# Patient Record
Sex: Male | Born: 1995 | Race: Black or African American | Hispanic: No | Marital: Single | Smoking: Never smoker
Health system: Southern US, Community
[De-identification: ages and names within clinical notes are randomized; demographics above are authoritative.]

## PROBLEM LIST (undated history)

## (undated) DIAGNOSIS — Z9109 Other allergy status, other than to drugs and biological substances: Secondary | ICD-10-CM

## (undated) DIAGNOSIS — J45909 Unspecified asthma, uncomplicated: Secondary | ICD-10-CM

## (undated) HISTORY — PX: HERNIA REPAIR: SHX51

---
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
## 2017-05-29 ENCOUNTER — Other Ambulatory Visit: Payer: Self-pay

## 2017-05-29 ENCOUNTER — Emergency Department
Admission: EM | Admit: 2017-05-29 | Discharge: 2017-05-29 | Disposition: A | Discharge status: Home / Self Care | Payer: 59 | Source: Home / Self Care | Attending: Family Medicine | Admitting: Family Medicine

## 2017-05-29 DIAGNOSIS — R69 Illness, unspecified: Secondary | ICD-10-CM

## 2017-05-29 DIAGNOSIS — J111 Influenza due to unidentified influenza virus with other respiratory manifestations: Secondary | ICD-10-CM

## 2017-05-29 MED ORDER — BENZONATATE 200 MG PO CAPS: ORAL_CAPSULE | ORAL | 0 refills | Status: DC

## 2017-05-29 MED ORDER — AZITHROMYCIN 250 MG PO TABS: ORAL_TABLET | ORAL | 0 refills | Status: DC

## 2017-05-29 NOTE — ED Provider Notes (Signed)
[name]    CSN: [id number] Arrival date & time: 05/29/17  1127     History   Chief Complaint Chief Complaint  Patient presents with  . Cough    HPI Terry Reynolds is a 22 y.o. male.   Complains of 4 day history flu-like illness including myalgias, headache, chills, fatigue, and cough.  Also has mild nasal congestion and sore throat.  Cough is non-productive and worse at night.  He complains of tightness in his anterior chest and sensation of shortness of breath but no wheezing.   The history is provided by the patient.    Past Medical History:  Diagnosis Date  . Asthma    Pt mom states he has not had asthma symptoms or used an inhaler since middle school  . Environmental allergies     Patient Active Problem List   Diagnosis Date Noted  . URI 07/01/2009  . ALLERGIC RHINITIS 07/01/2009  . ASTHMA 07/01/2009    Past Surgical History:  Procedure Laterality Date  . HERNIA REPAIR         Home Medications    Prior to Admission medications   Medication Sig Start Date End Date Taking? Authorizing Provider  azithromycin (ZITHROMAX Z-PAK) 250 MG tablet Take 2 tabs today; then begin one tab once daily for 4 more days. 05/29/17   Lattie HawBeese, Danine Hor A, MD  benzonatate (TESSALON) 200 MG capsule Take one cap by mouth at bedtime as needed for cough.  May repeat in 4 to 6 hours 05/29/17   Lattie HawBeese, Suhaylah Wampole A, MD  penicillin v potassium (VEETID) 500 MG tablet Take one tab by mouth twice daily for 10 days 07/14/15   Lattie HawBeese, Lyrick Lagrand A, MD    Family History Family History  Problem Relation Age of Onset  . Hypertension Mother   . Thyroid disease Mother   . Hyperlipidemia Father   . Heart failure Father     Social History Social History   Tobacco Use  . Smoking status: Never Smoker  . Smokeless tobacco: Never Used  Substance Use Topics  . Alcohol use: No  . Drug use: No     Allergies   Codeine   Review of Systems Review of Systems + sore throat +  cough No pleuritic pain No wheezing + nasal congestion + post-nasal drainage No sinus pain/pressure No itchy/red eyes No earache No hemoptysis ? SOB No fever, + chills No nausea No vomiting No abdominal pain No diarrhea No urinary symptoms No skin rash + fatigue + myalgias + headache Used OTC meds without relief   Physical Exam Triage Vital Signs ED Triage Vitals [05/29/17 1154]  Enc Vitals Group     BP 108/74     Pulse Rate 66     Resp      Temp 98.3 F (36.8 C)     Temp Source Oral     SpO2 97 %     Weight 137 lb (62.1 kg)     Height 5\' 9"  (1.753 m)     Head Circumference      Peak Flow      Pain Score 0     Pain Loc      Pain Edu?      Excl. in GC?    No data found.  Updated Vital Signs BP 108/74 (BP Location: Right Arm)   Pulse 66   Temp 98.3 F (36.8 C) (Oral)   Ht 5\' 9"  (1.753 m)   Wt 137 lb (62.1  kg)   SpO2 97%   BMI 20.23 kg/m   Visual Acuity Right Eye Distance:   Left Eye Distance:   Bilateral Distance:    Right Eye Near:   Left Eye Near:    Bilateral Near:     Physical Exam Nursing notes and Vital Signs reviewed. Appearance:  Patient appears stated age, and in no acute distress Eyes:  Pupils are equal, round, and reactive to light and accomodation.  Extraocular movement is intact.  Conjunctivae are not inflamed  Ears:  Canals normal.  Tympanic membranes normal.  Nose:  Mildly congested turbinates.  No sinus tenderness.  Pharynx:  Normal Neck:  Supple.  Enlarged posterior/lateral nodes are palpated bilaterally, tender to palpation on the left.   Lungs:  Clear to auscultation.  Breath sounds are equal.  Moving air well. Chest:  Distinct tenderness to palpation over the mid-sternum.  Heart:  Regular rate and rhythm without murmurs, rubs, or gallops.  Abdomen:  Nontender without masses or hepatosplenomegaly.  Bowel sounds are present.  No CVA or flank tenderness.  Extremities:  No edema.  Skin:  No rash present.    UC Treatments /  Results  Labs (all labs ordered are listed, but only abnormal results are displayed) Labs Reviewed - No data to display  EKG  EKG Interpretation None       Radiology No results found.  Procedures Procedures (including critical care time)  Medications Ordered in UC Medications - No data to display   Initial Impression / Assessment and Plan / UC Course  I have reviewed the triage vital signs and the nursing notes.  Pertinent labs & imaging results that were available during my care of the patient were reviewed by me and considered in my medical decision making (see chart for details).    Patient has missed 48 hour window of opportunity for beginning Tamiflu.  Begin empiric Z-pak.  Prescription written for Benzonatate [facility]) to take at bedtime for night-time cough.  Take plain guaifenesin (1200mg  extended release tabs such as Mucinex) twice daily, with plenty of water, for cough and congestion.  May add Pseudoephedrine (30mg , one or two every 4 to 6 hours) for sinus congestion.  Get adequate rest.   May use Afrin nasal spray (or generic oxymetazoline) each morning for about 5 days and then discontinue.  Also recommend using saline nasal spray several times daily and saline nasal irrigation (AYR is a common brand).  Use Flonase nasal spray each morning after using Afrin nasal spray and saline nasal irrigation. Try warm salt water gargles for sore throat.  Stop all antihistamines for now, and other non-prescription cough/cold preparations. May take Ibuprofen 200mg , 4 tabs every 8 hours with food for chest/sternum discomfort. Followup with Family Doctor if not improved in one week.     Final Clinical Impressions(s) / UC Diagnoses   Final diagnoses:  Influenza-like illness    ED Discharge Orders        Ordered    azithromycin (ZITHROMAX Z-PAK) 250 MG tablet     05/29/17 1222    benzonatate (TESSALON) 200 MG capsule     05/29/17 1222           Lattie Haw,  MD 05/29/17 1228

## 2017-05-29 NOTE — ED Triage Notes (Signed)
Congestion and cough started Tuesday, soreness and tightness in the chest started yesterday.

## 2017-05-29 NOTE — Discharge Instructions (Signed)
Take plain guaifenesin (1200mg extended release tabs such as Mucinex) twice daily, with plenty of water, for cough and congestion.  May add Pseudoephedrine (30mg, one or two every 4 to 6 hours) for sinus congestion.  Get adequate rest.   °May use Afrin nasal spray (or generic oxymetazoline) each morning for about 5 days and then discontinue.  Also recommend using saline nasal spray several times daily and saline nasal irrigation (AYR is a common brand).  Use Flonase nasal spray each morning after using Afrin nasal spray and saline nasal irrigation. °Try warm salt water gargles for sore throat.  °Stop all antihistamines for now, and other non-prescription cough/cold preparations. °May take Ibuprofen 200mg, 4 tabs every 8 hours with food for chest/sternum discomfort. °  °

## 2018-10-25 ENCOUNTER — Emergency Department
Admission: EM | Admit: 2018-10-25 | Discharge: 2018-10-25 | Disposition: A | Discharge status: Home / Self Care | Payer: 59 | Source: Home / Self Care | Attending: Family Medicine | Admitting: Family Medicine

## 2018-10-25 ENCOUNTER — Other Ambulatory Visit: Payer: Self-pay

## 2018-10-25 DIAGNOSIS — R059 Cough, unspecified: Secondary | ICD-10-CM

## 2018-10-25 DIAGNOSIS — J029 Acute pharyngitis, unspecified: Secondary | ICD-10-CM | POA: Diagnosis not present

## 2018-10-25 DIAGNOSIS — R05 Cough: Secondary | ICD-10-CM

## 2018-10-25 DIAGNOSIS — J069 Acute upper respiratory infection, unspecified: Secondary | ICD-10-CM

## 2018-10-25 LAB — POCT RAPID STREP A (OFFICE): Rapid Strep A Screen: NEGATIVE

## 2018-10-25 NOTE — ED Triage Notes (Signed)
Sore throat started yesterday.  Coughing for 2-3 weeks

## 2018-10-25 NOTE — ED Provider Notes (Signed)
[name]    CSN: [id number] Arrival date & time: 10/25/18  0855     History   Chief Complaint Chief Complaint  Patient presents with  . Cough  . Sore Throat    HPI Terry Reynolds is a 23 y.o. male.   Patient developed a mild cough about 2 weeks ago but has not felt ill.  Yesterday he developed a "scratchy" throat, but feels well otherwise.  No fevers, chills, and sweats.  No chest tightness, shortness of breath, or wheezing.  No loss of taste/smell.  He has seasonal rhinitis and a past history of asthma.  The history is provided by the patient.    Past Medical History:  Diagnosis Date  . Asthma    Pt mom states he has not had asthma symptoms or used an inhaler since middle school  . Environmental allergies     Patient Active Problem List   Diagnosis Date Noted  . URI 07/01/2009  . ALLERGIC RHINITIS 07/01/2009  . ASTHMA 07/01/2009    Past Surgical History:  Procedure Laterality Date  . HERNIA REPAIR         Home Medications    Prior to Admission medications   Not on File    Family History Family History  Problem Relation Age of Onset  . Hypertension Mother   . Thyroid disease Mother   . Hyperlipidemia Father   . Heart failure Father     Social History Social History   Tobacco Use  . Smoking status: Never Smoker  . Smokeless tobacco: Never Used  Substance Use Topics  . Alcohol use: No  . Drug use: No     Allergies   Codeine   Review of Systems Review of Systems + sore throat + cough No pleuritic pain No wheezing Minimal nasal congestion No post-nasal drainage No sinus pain/pressure No itchy/red eyes No earache No hemoptysis No SOB No fever/chills No nausea No vomiting No abdominal pain No diarrhea No urinary symptoms No skin rash No fatigue No myalgias No headache Used OTC meds without relief   Physical Exam Triage Vital Signs ED Triage Vitals  Enc Vitals Group     BP 10/25/18 0911 133/77    Pulse Rate 10/25/18 0911 73     Resp 10/25/18 0911 20     Temp 10/25/18 0911 98.2 F (36.8 C)     Temp Source 10/25/18 0911 Oral     SpO2 10/25/18 0911 99 %     Weight 10/25/18 0912 165 lb (74.8 kg)     Height 10/25/18 0912 5\' 9"  (1.753 m)     Head Circumference --      Peak Flow --      Pain Score 10/25/18 0912 0     Pain Loc --      Pain Edu? --      Excl. in GC? --    No data found.  Updated Vital Signs BP 133/77 (BP Location: Right Arm)   Pulse 73   Temp 98.2 F (36.8 C) (Oral)   Resp 20   Ht 5\' 9"  (1.753 m)   Wt 74.8 kg   SpO2 99%   BMI 24.37 kg/m   Visual Acuity Right Eye Distance:   Left Eye Distance:   Bilateral Distance:    Right Eye Near:   Left Eye Near:    Bilateral Near:     Physical Exam Nursing notes and Vital Signs reviewed. Appearance:  Patient appears stated age, and in  no acute distress Eyes:  Pupils are equal, round, and reactive to light and accomodation.  Extraocular movement is intact.  Conjunctivae are not inflamed  Ears:  Canals normal.  Tympanic membranes normal.  Nose:  Mildly congested turbinates.  No sinus tenderness.   Pharynx:  Normal Neck:  Supple.  Enlarged posterior/lateral nodes are palpated bilaterally, tender to palpation on the left.   Lungs:  Clear to auscultation.  Breath sounds are equal.  Moving air well. Heart:  Regular rate and rhythm without murmurs, rubs, or gallops.  Abdomen:  Nontender without masses or hepatosplenomegaly.  Bowel sounds are present.  No CVA or flank tenderness.  Extremities:  No edema.  Skin:  No rash present.    UC Treatments / Results  Labs (all labs ordered are listed, but only abnormal results are displayed) Labs Reviewed  NOVEL CORONAVIRUS, NAA  STREP A DNA PROBE  POCT RAPID STREP A (OFFICE) negative    EKG   Radiology No results found.  Procedures Procedures (including critical care time)  Medications Ordered in UC Medications - No data to display  Initial Impression /  Assessment and Plan / UC Course  I have reviewed the triage vital signs and the nursing notes.  Pertinent labs & imaging results that were available during my care of the patient were reviewed by me and considered in my medical decision making (see chart for details).    There is no evidence of bacterial infection today.   Treat symptomatically for now.  Strep A DNA probe and COVID10 pending. Followup with Family Doctor if not improved in 7 to 10 days.   Final Clinical Impressions(s) / UC Diagnoses   Final diagnoses:  Sore throat  Cough  Viral URI with cough     Discharge Instructions     Take plain guaifenesin (1200mg  extended release tabs such as Mucinex) twice daily, with plenty of water, for cough and congestion.  May add Pseudoephedrine (30mg , one or two every 4 to 6 hours) for sinus congestion.  Get adequate rest.    Try warm salt water gargles for sore throat.  Stop all antihistamines for now, and other non-prescription cough/cold preparations. May take Ibuprofen 200mg , 4 tabs every 8 hours with food for sore throat May take Delsym Cough Suppressant at bedtime for nighttime cough.     ED Prescriptions    None           Kandra Nicolas, MD 10/25/18 [phone]

## 2018-10-25 NOTE — Discharge Instructions (Addendum)
Take plain guaifenesin (1200mg  extended release tabs such as Mucinex) twice daily, with plenty of water, for cough and congestion.  May add Pseudoephedrine (30mg , one or two every 4 to 6 hours) for sinus congestion.  Get adequate rest.    Try warm salt water gargles for sore throat.  Stop all antihistamines for now, and other non-prescription cough/cold preparations. May take Ibuprofen 200mg , 4 tabs every 8 hours with food for sore throat May take Delsym Cough Suppressant at bedtime for nighttime cough.

## 2018-10-26 ENCOUNTER — Telehealth: Payer: Self-pay

## 2018-10-26 LAB — STREP A DNA PROBE: Group A Strep Probe: NOT DETECTED

## 2018-10-26 NOTE — Telephone Encounter (Signed)
Spoke with patient, gave strep results.  Will call with COVID once resulted.

## 2018-10-27 LAB — NOVEL CORONAVIRUS, NAA: SARS-CoV-2, NAA: NOT DETECTED

## 2018-11-03 NOTE — Telephone Encounter (Signed)
Spoke with patient, Gave Neg COVID results

## 2019-05-05 ENCOUNTER — Emergency Department
Admission: EM | Admit: 2019-05-05 | Discharge: 2019-05-05 | Disposition: A | Discharge status: Home / Self Care | Payer: 59 | Source: Home / Self Care | Attending: Family Medicine | Admitting: Family Medicine

## 2019-05-05 ENCOUNTER — Other Ambulatory Visit: Payer: Self-pay

## 2019-05-05 DIAGNOSIS — R300 Dysuria: Secondary | ICD-10-CM | POA: Diagnosis not present

## 2019-05-05 DIAGNOSIS — R369 Urethral discharge, unspecified: Secondary | ICD-10-CM

## 2019-05-05 LAB — POCT URINALYSIS DIP (MANUAL ENTRY)
Bilirubin, UA: NEGATIVE
Glucose, UA: NEGATIVE mg/dL
Nitrite, UA: NEGATIVE
Spec Grav, UA: 1.015 (ref 1.010–1.025)
Urobilinogen, UA: 0.2 E.U./dL
pH, UA: 6 (ref 5.0–8.0)

## 2019-05-05 MED ORDER — AZITHROMYCIN 1 G PO PACK
1.0000 g | PACK | Freq: Once | ORAL | Status: AC
  Administered 2019-05-05: 1 g via ORAL

## 2019-05-05 MED ORDER — CEFTRIAXONE SODIUM 500 MG IJ SOLR
500.0000 mg | Freq: Once | INTRAMUSCULAR | Status: AC
  Administered 2019-05-05: 500 mg via INTRAMUSCULAR

## 2019-05-05 NOTE — ED Provider Notes (Signed)
[facility]    CSN: [id number] Arrival date & time: 05/05/19  1714      History   Chief Complaint Chief Complaint  Patient presents with  . Dysuria    tingling  . Penile Discharge    HPI TRYGG MANTZ II is a 24 y.o. male.   Patient developed dysuria and whitish urethral discharge about 4 days ago.  He had unprotected sex about 12 days ago and is concerned about possible STD.  He denies testicular pain/swelling, rash, abdominal pain, and fever.  He feels well otherwise.  He has not had STD testing in the past.  The history is provided by the patient.  Dysuria Presenting symptoms: dysuria and penile discharge   Presenting symptoms: no penile pain   Context: spontaneously   Relieved by:  Nothing Worsened by:  Urination Ineffective treatments:  None tried Associated symptoms: no abdominal pain, no fever, no flank pain, no genital itching, no genital lesions, no genital rash, no groin pain, no hematuria, no nausea, no penile redness, no penile swelling, no scrotal swelling, no urinary frequency, no urinary hesitation, no urinary incontinence and no urinary retention   Risk factors: unprotected sex   Penile Discharge Pertinent negatives include no abdominal pain.    Past Medical History:  Diagnosis Date  . Asthma    Pt mom states he has not had asthma symptoms or used an inhaler since middle school  . Environmental allergies     Patient Active Problem List   Diagnosis Date Noted  . URI 07/01/2009  . ALLERGIC RHINITIS 07/01/2009  . ASTHMA 07/01/2009    Past Surgical History:  Procedure Laterality Date  . HERNIA REPAIR         Home Medications    Prior to Admission medications   Not on File    Family History Family History  Problem Relation Age of Onset  . Hypertension Mother   . Thyroid disease Mother   . Hyperlipidemia Father   . Heart failure Father     Social History Social History   Tobacco Use  . Smoking status: Never Smoker    . Smokeless tobacco: Never Used  Substance Use Topics  . Alcohol use: Yes    Comment: twice a week  . Drug use: No     Allergies   Codeine   Review of Systems Review of Systems  Constitutional: Negative.  Negative for fever.  Gastrointestinal: Negative for abdominal pain and nausea.  Genitourinary: Positive for discharge and dysuria. Negative for bladder incontinence, decreased urine volume, flank pain, frequency, hematuria, hesitancy, penile pain, penile swelling, scrotal swelling, testicular pain and urgency.  Musculoskeletal: Negative for arthralgias.  Skin: Negative for rash.  All other systems reviewed and are negative.    Physical Exam Triage Vital Signs ED Triage Vitals [05/05/19 1730]  Enc Vitals Group     BP 136/80     Pulse Rate 81     Resp 20     Temp 98.2 F (36.8 C)     Temp Source Oral     SpO2 98 %     Weight 156 lb (70.8 kg)     Height 5\' 9"  (1.753 m)     Head Circumference      Peak Flow      Pain Score 0     Pain Loc      Pain Edu?      Excl. in [place]?    No data found.  Updated Vital Signs BP  136/80 (BP Location: Right Arm)   Pulse 81   Temp 98.2 F (36.8 C) (Oral)   Resp 20   Ht 5\' 9"  (1.753 m)   Wt 70.8 kg   SpO2 98%   BMI 23.04 kg/m   Visual Acuity Right Eye Distance:   Left Eye Distance:   Bilateral Distance:    Right Eye Near:   Left Eye Near:    Bilateral Near:     Physical Exam Vitals and nursing note reviewed.  Constitutional:      General: He is not in acute distress.    Appearance: He is not ill-appearing.  HENT:     Head: Normocephalic.  Eyes:     Pupils: Pupils are equal, round, and reactive to light.  Cardiovascular:     Rate and Rhythm: Normal rate.  Pulmonary:     Effort: Pulmonary effort is normal.  Neurological:     Mental Status: He is alert.      UC Treatments / Results  Labs (all labs ordered are listed, but only abnormal results are displayed) Labs Reviewed  POCT URINALYSIS DIP (MANUAL  ENTRY) - Abnormal; Notable for the following components:      Result Value   Ketones, POC UA small (15) (*)    Blood, UA moderate (*)    Protein Ur, POC trace (*)    Leukocytes, UA Small (1+) (*)    All other components within normal limits  C. TRACHOMATIS/N. GONORRHOEAE RNA  URINE CULTURE  HIV ANTIBODY (ROUTINE TESTING W REFLEX)  RPR    EKG   Radiology No results found.  Procedures Procedures (including critical care time)  Medications Ordered in UC Medications  cefTRIAXone (ROCEPHIN) injection 500 mg (500 mg Intramuscular Given 05/05/19 1757)  azithromycin (ZITHROMAX) powder 1 g (1 g Oral Given 05/05/19 1804)    Initial Impression / Assessment and Plan / UC Course  I have reviewed the triage vital signs and the nursing notes.  Pertinent labs & imaging results that were available during my care of the patient were reviewed by me and considered in my medical decision making (see chart for details).    Administered Rocephin 500mg  IM, azithromycin 1gm PO. GC/chlamydia, RPR, urine culture, and HIV antibodies pending. Followup with Family Doctor if not improved in one week.     Final Clinical Impressions(s) / UC Diagnoses   Final diagnoses:  Dysuria  Urethral discharge in male     Discharge Instructions     Please refrain from sexual intercourse for 7 days to give the medicine time to work.  Sexual partners need to be notified and tested/treated.  Condoms may reduce risk of reinfection.  Recheck or followup with PCP for further evaluation if symptoms are not improving.     ED Prescriptions    None        07/03/19, MD 05/05/19 1820

## 2019-05-05 NOTE — ED Triage Notes (Signed)
Pt noticed tingling and discharge from the penis several days.  Pt states that he had unprotected sex about 12 days ago.  Pt is concerned about STD.

## 2019-05-05 NOTE — Discharge Instructions (Addendum)
Please refrain from sexual intercourse for 7 days to give the medicine time to work.  Sexual partners need to be notified and tested/treated.  Condoms may reduce risk of reinfection.  Recheck or followup with PCP for further evaluation if symptoms are not improving.

## 2019-05-06 LAB — URINE CULTURE
MICRO NUMBER:: 10118039
Result:: NO GROWTH
SPECIMEN QUALITY:: ADEQUATE

## 2019-05-06 LAB — C. TRACHOMATIS/N. GONORRHOEAE RNA
C. trachomatis RNA, TMA: DETECTED — AB
N. gonorrhoeae RNA, TMA: NOT DETECTED

## 2019-05-06 LAB — HIV ANTIBODY (ROUTINE TESTING W REFLEX): HIV 1&2 Ab, 4th Generation: NONREACTIVE

## 2019-05-09 ENCOUNTER — Telehealth (HOSPITAL_COMMUNITY): Payer: Self-pay | Admitting: Emergency Medicine

## 2019-05-09 NOTE — Telephone Encounter (Signed)
Chlamydia is positive.  This was treated at the urgent care visit with po zithromax 1g.  Please refrain from sexual intercourse for 7 days to give the medicine time to work.  Sexual partners need to be notified and tested/treated.  Condoms may reduce risk of reinfection.  Recheck or followup with PCP for further evaluation if symptoms are not improving.  [facility] notified.  Patient contacted by phone and made aware of    results. Pt verbalized understanding and had all questions answered.    

## 2019-06-01 ENCOUNTER — Other Ambulatory Visit: Payer: Self-pay

## 2019-06-01 ENCOUNTER — Emergency Department (INDEPENDENT_AMBULATORY_CARE_PROVIDER_SITE_OTHER)
Admission: EM | Admit: 2019-06-01 | Discharge: 2019-06-01 | Disposition: A | Discharge status: Home / Self Care | Payer: 59 | Source: Home / Self Care | Attending: Family Medicine | Admitting: Family Medicine

## 2019-06-01 ENCOUNTER — Encounter: Payer: Self-pay | Admitting: Emergency Medicine

## 2019-06-01 DIAGNOSIS — R55 Syncope and collapse: Secondary | ICD-10-CM | POA: Diagnosis not present

## 2019-06-01 NOTE — ED Triage Notes (Signed)
Was being examined by Optomologist, Dr was pressing on his eye with a q-tip he felt hot and started sliding out of the chair. EMS was called, checked his BP, EKG, CBG all normal, feels fine now.

## 2019-06-01 NOTE — ED Provider Notes (Signed)
[facility]    CSN: [id number] Arrival date & time: 06/01/19  1713      History   Chief Complaint Chief Complaint  Patient presents with  . Loss of Consciousness    HPI Terry Reynolds is a 24 y.o. male.   While in an opthomologist's office about 5.5 hours ago, patient suddenly lost consciousness briefly after the provider began to examine his right eye.  He suddenly began to feel hot and light-headed before losing consciousness but awoke rapidly.  EMS was called, and found his BP, CBG and EKG to be normal. Patient is completely assymptomatic at present.   The history is provided by the patient.  Loss of Consciousness Episode history:  Single Most recent episode:  Today Progression:  Resolved Chronicity:  New Context comment:  Eye examination Witnessed: yes   Relieved by:  Nothing Worsened by:  Nothing Ineffective treatments:  None tried Associated symptoms: dizziness and visual change   Associated symptoms: no chest pain, no confusion, no diaphoresis, no difficulty breathing, no fever, no focal sensory loss, no focal weakness, no headaches, no malaise/fatigue, no nausea, no palpitations, no recent fall, no seizures, no shortness of breath and no vomiting     Past Medical History:  Diagnosis Date  . Asthma    Pt mom states he has not had asthma symptoms or used an inhaler since middle school  . Environmental allergies     Patient Active Problem List   Diagnosis Date Noted  . URI 07/01/2009  . ALLERGIC RHINITIS 07/01/2009  . ASTHMA 07/01/2009    Past Surgical History:  Procedure Laterality Date  . HERNIA REPAIR         Home Medications    Prior to Admission medications   Not on File    Family History Family History  Problem Relation Age of Onset  . Hypertension Mother   . Thyroid disease Mother   . Hyperlipidemia Father   . Heart failure Father     Social History Social History   Tobacco Use  . Smoking status: Never Smoker    . Smokeless tobacco: Never Used  Substance Use Topics  . Alcohol use: Yes    Comment: twice a week  . Drug use: No     Allergies   Codeine   Review of Systems Review of Systems  Constitutional: Negative for diaphoresis, fever and malaise/fatigue.  Respiratory: Negative for shortness of breath.   Cardiovascular: Positive for syncope. Negative for chest pain and palpitations.  Gastrointestinal: Negative for nausea and vomiting.  Neurological: Positive for dizziness. Negative for focal weakness, seizures and headaches.  Psychiatric/Behavioral: Negative for confusion.  All other systems reviewed and are negative.    Physical Exam Triage Vital Signs ED Triage Vitals  Enc Vitals Group     BP 06/01/19 1734 126/77     Pulse Rate 06/01/19 1734 67     Resp --      Temp 06/01/19 1734 98.7 F (37.1 C)     Temp Source 06/01/19 1734 Oral     SpO2 06/01/19 1734 100 %     Weight 06/01/19 1735 160 lb (72.6 kg)     Height 06/01/19 1735 5\' 9"  (1.753 m)     Head Circumference --      Peak Flow --      Pain Score 06/01/19 1735 0     Pain Loc --      Pain Edu? --      Excl. in [place]? --  Orthostatic VS for the past 24 hrs:  BP- Lying Pulse- Lying BP- Sitting Pulse- Sitting BP- Standing at 0 minutes Pulse- Standing at 0 minutes  06/01/19 1738 125/74 72 125/77 73 123/83 83    Updated Vital Signs BP 126/77 (BP Location: Right Arm)   Pulse 67   Temp 98.7 F (37.1 C) (Oral)   Ht 5\' 9"  (1.753 m)   Wt 72.6 kg   SpO2 100%   BMI 23.63 kg/m   Visual Acuity Right Eye Distance:   Left Eye Distance:   Bilateral Distance:    Right Eye Near:   Left Eye Near:    Bilateral Near:     Physical Exam Nursing notes and Vital Signs reviewed. Appearance:  Patient appears stated age, and in no acute distress Eyes:  Pupils are equal, round, and reactive to light and accomodation.  Extraocular movement is intact.  Conjunctivae are not inflamed  Ears:  Canals normal.  Tympanic membranes  normal.  Nose:  Normal turbinates.  No sinus tenderness.   Pharynx:  Normal Neck:  Supple. No adenopathy or thyromegaly.  Carotids have normal upstrokes. Lungs:  Clear to auscultation.  Breath sounds are equal.  Moving air well. Heart:  Regular rate and rhythm without murmurs, rubs, or gallops.  Abdomen:  Nontender without masses or hepatosplenomegaly.  Bowel sounds are present.  No CVA or flank tenderness.  Extremities:  No edema.  Skin:  No rash present.   UC Treatments / Results  Labs (all labs ordered are listed, but only abnormal results are displayed) Labs Reviewed - No data to display  EKG Review of EKG done by EMS today: Rate:  53 BPM PR:  150 msec QT:  428 msec QTcH:  416 msec QRSD:  88/2 msec QRS axis:  48 degrees Interpretation:   Sinus bradycardia; otherwise no acute changes  Radiology No results found.  Procedures Procedures (including critical care time)  Medications Ordered in UC Medications - No data to display  Initial Impression / Assessment and Plan / UC Course  I have reviewed the triage vital signs and the nursing notes.  Pertinent labs & imaging results that were available during my care of the patient were reviewed by me and considered in my medical decision making (see chart for details).    Patient has benign exam, and orthostatic vital signs show no significant variation.  EKG done by EMS normal except for mild sinus bradycardia.  Patient appears to have had an episode of vasovagal syncope.   Final Clinical Impressions(s) / UC Diagnoses   Final diagnoses:  Vasovagal syncope     Discharge Instructions     Stay well hydrated:  drink plenty of fluids.  If symptoms become significantly worse during the night or over the weekend, proceed to the local emergency room.     ED Prescriptions    None        , MD 06/02/19 [phone]

## 2019-06-01 NOTE — Discharge Instructions (Signed)
Stay well hydrated:  drink plenty of fluids.  If symptoms become significantly worse during the night or over the weekend, proceed to the local emergency room.

## 2019-06-30 ENCOUNTER — Other Ambulatory Visit: Payer: Self-pay

## 2019-06-30 ENCOUNTER — Emergency Department
Admission: EM | Admit: 2019-06-30 | Discharge: 2019-06-30 | Disposition: A | Discharge status: Home / Self Care | Payer: 59 | Source: Home / Self Care | Attending: Family Medicine | Admitting: Family Medicine

## 2019-06-30 DIAGNOSIS — Z202 Contact with and (suspected) exposure to infections with a predominantly sexual mode of transmission: Secondary | ICD-10-CM

## 2019-06-30 MED ORDER — CEFTRIAXONE SODIUM 500 MG IJ SOLR
500.0000 mg | Freq: Once | INTRAMUSCULAR | Status: AC
  Administered 2019-06-30: 18:00:00 500 mg via INTRAMUSCULAR

## 2019-06-30 MED ORDER — AZITHROMYCIN 1 G PO PACK
1.0000 g | PACK | Freq: Once | ORAL | Status: AC
  Administered 2019-06-30: 1 g via ORAL

## 2019-06-30 NOTE — ED Provider Notes (Signed)
[facility]    CSN: [id number] Arrival date & time: 06/30/19  1628      History   Chief Complaint Chief Complaint  Patient presents with  . Exposure to STD    HPI Terry Reynolds is a 24 y.o. male.   Patient presents for STD testing after being informed by his partner that he needs to be tested.  He is completely assymptomatic at present.  The history is provided by the patient.    Past Medical History:  Diagnosis Date  . Asthma    Pt mom states he has not had asthma symptoms or used an inhaler since middle school  . Environmental allergies     Patient Active Problem List   Diagnosis Date Noted  . URI 07/01/2009  . ALLERGIC RHINITIS 07/01/2009  . ASTHMA 07/01/2009    Past Surgical History:  Procedure Laterality Date  . HERNIA REPAIR         Home Medications    Prior to Admission medications   Not on File    Family History Family History  Problem Relation Age of Onset  . Hypertension Mother   . Thyroid disease Mother   . Hyperlipidemia Father   . Heart failure Father     Social History Social History   Tobacco Use  . Smoking status: Never Smoker  . Smokeless tobacco: Never Used  Substance Use Topics  . Alcohol use: Yes    Comment: socially  . Drug use: No     Allergies   Codeine   Review of Systems Review of Systems  Constitutional: Negative for activity change, chills, diaphoresis, fatigue, fever and unexpected weight change.  HENT: Negative.   Eyes: Negative.   Respiratory: Negative.   Cardiovascular: Negative.   Gastrointestinal: Negative.   Genitourinary: Negative for discharge, dysuria, flank pain, frequency, genital sores, hematuria, penile swelling, scrotal swelling, testicular pain and urgency.  Musculoskeletal: Negative for arthralgias.  Skin: Negative for rash.     Physical Exam Triage Vital Signs ED Triage Vitals  Enc Vitals Group     BP 06/30/19 1640 (!) 142/89     Pulse Rate 06/30/19 1640 92       Resp 06/30/19 1640 18     Temp 06/30/19 1640 98.8 F (37.1 C)     Temp Source 06/30/19 1640 Oral     SpO2 06/30/19 1640 100 %     Weight --      Height --      Head Circumference --      Peak Flow --      Pain Score 06/30/19 1638 0     Pain Loc --      Pain Edu? --      Excl. in [place]? --    No data found.  Updated Vital Signs BP (!) 142/89 (BP Location: Left Arm)   Pulse 92   Temp 98.8 F (37.1 C) (Oral)   Resp 18   SpO2 100%   Visual Acuity Right Eye Distance:   Left Eye Distance:   Bilateral Distance:    Right Eye Near:   Left Eye Near:    Bilateral Near:     Physical Exam Vitals and nursing note reviewed.  Constitutional:      General: He is not in acute distress. HENT:     Head: Normocephalic.  Eyes:     Pupils: Pupils are equal, round, and reactive to light.  Cardiovascular:     Rate and Rhythm: Normal rate.  Pulmonary:     Effort: Pulmonary effort is normal.  Neurological:     Mental Status: He is alert.      UC Treatments / Results  Labs (all labs ordered are listed, but only abnormal results are displayed) Labs Reviewed  GC/CHLAMYDIA PROBE AMP  HIV ANTIBODY (ROUTINE TESTING W REFLEX)  RPR    EKG   Radiology No results found.  Procedures Procedures (including critical care time)  Medications Ordered in UC Medications  cefTRIAXone (ROCEPHIN) injection 500 mg (500 mg Intramuscular Given 06/30/19 1743)  azithromycin (ZITHROMAX) powder 1 g (1 g Oral Given 06/30/19 1743)    Initial Impression / Assessment and Plan / UC Course  I have reviewed the triage vital signs and the nursing notes.  Pertinent labs & imaging results that were available during my care of the patient were reviewed by me and considered in my medical decision making (see chart for details).    GC/chlamydia, RPR, and HIV ab pending. Administered Rocephin 500mg  IM, and azithromycin 1gm PO.   Final Clinical Impressions(s) / UC Diagnoses   Final diagnoses:  Possible  exposure to STD     Discharge Instructions     Please refrain from sexual intercourse until negative lab results are available.  If testing is positive, sexual partners need to be notified and tested/treated.  Condoms may reduce risk of infection.       ED Prescriptions    None        , MD 07/02/19 1221

## 2019-06-30 NOTE — ED Triage Notes (Signed)
Patient presents to Urgent Care with complaints of exposure to chlamydia since his partner told him he needed to be treated. Patient reports he is not having any symptoms.

## 2019-06-30 NOTE — Discharge Instructions (Addendum)
Please refrain from sexual intercourse until negative lab results are available.  If testing is positive, sexual partners need to be notified and tested/treated.  Condoms may reduce risk of infection.

## 2019-07-01 ENCOUNTER — Telehealth (HOSPITAL_COMMUNITY): Payer: Self-pay | Admitting: *Deleted

## 2019-07-01 LAB — HIV ANTIBODY (ROUTINE TESTING W REFLEX): HIV 1&2 Ab, 4th Generation: NONREACTIVE

## 2019-07-01 LAB — C. TRACHOMATIS/N. GONORRHOEAE RNA
C. trachomatis RNA, TMA: DETECTED — AB
N. gonorrhoeae RNA, TMA: NOT DETECTED

## 2019-07-01 LAB — RPR: RPR Ser Ql: NONREACTIVE

## 2019-07-01 MED ORDER — METRONIDAZOLE 500 MG PO TABS: 500.0000 mg | ORAL_TABLET | Freq: Two times a day (BID) | ORAL | 0 refills | Status: AC

## 2019-07-01 NOTE — Telephone Encounter (Signed)
GC probe pending, tx'd with 500mg  rocpehin and 1gm zithromax on DOS. WIll call patient if anything results positive, if negative will not call patient back.   Trichomonas is positive. Rx for Flagyl 500mg  BID x 7 days was sent to the pharmacy of record. Education provided to patient, v/o understanding.

## 2023-07-27 ENCOUNTER — Ambulatory Visit (INDEPENDENT_AMBULATORY_CARE_PROVIDER_SITE_OTHER): Payer: Worker's Compensation

## 2023-07-27 ENCOUNTER — Other Ambulatory Visit: Payer: Self-pay | Admitting: Nurse Practitioner

## 2023-07-27 DIAGNOSIS — W19XXXD Unspecified fall, subsequent encounter: Secondary | ICD-10-CM | POA: Diagnosis not present

## 2023-07-27 DIAGNOSIS — T1490XA Injury, unspecified, initial encounter: Secondary | ICD-10-CM
